# Patient Record
Sex: Female | Born: 2005 | Race: White | Hispanic: No | Marital: Single | Smoking: Never smoker
Health system: Southern US, Community
[De-identification: ages and names within clinical notes are randomized; demographics above are authoritative.]

## PROBLEM LIST (undated history)

## (undated) DIAGNOSIS — J45909 Unspecified asthma, uncomplicated: Secondary | ICD-10-CM

## (undated) DIAGNOSIS — N390 Urinary tract infection, site not specified: Secondary | ICD-10-CM

## (undated) DIAGNOSIS — N289 Disorder of kidney and ureter, unspecified: Secondary | ICD-10-CM

## (undated) DIAGNOSIS — Z9109 Other allergy status, other than to drugs and biological substances: Secondary | ICD-10-CM

## (undated) HISTORY — DX: Unspecified asthma, uncomplicated: J45.909

## (undated) HISTORY — PX: EYE SURGERY: SHX253

---
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
## 2024-03-03 ENCOUNTER — Encounter: Payer: Self-pay | Admitting: Allergy & Immunology

## 2024-03-03 ENCOUNTER — Ambulatory Visit: Payer: 59 | Admitting: Allergy & Immunology

## 2024-03-03 ENCOUNTER — Other Ambulatory Visit: Payer: Self-pay

## 2024-03-03 VITALS — BP 124/80 | HR 84 | Temp 97.8°F | Resp 18 | Ht 66.0 in | Wt 172.0 lb

## 2024-03-03 DIAGNOSIS — J302 Other seasonal allergic rhinitis: Secondary | ICD-10-CM

## 2024-03-03 DIAGNOSIS — J4530 Mild persistent asthma, uncomplicated: Secondary | ICD-10-CM

## 2024-03-03 DIAGNOSIS — J3089 Other allergic rhinitis: Secondary | ICD-10-CM | POA: Diagnosis not present

## 2024-03-03 MED ORDER — MONTELUKAST SODIUM 10 MG PO TABS: 10.0000 mg | ORAL_TABLET | Freq: Every day | ORAL | 3 refills | Status: AC

## 2024-03-03 NOTE — Patient Instructions (Addendum)
 1. Mild persistent asthma, uncomplicated - Lung testing looks great today. - Asthma seems to be well controlled.  - Daily controller medication(s): Singulair  (montelukast ) 10mg  daily - Rescue medications: Albuterol  1-2 puffs every 4-6 hours as needed for wheezing/coughing.  - Asthma control goals:  * Full participation in all desired activities (may need albuterol  before activity) * Albuterol  use two time or less a week on average (not counting use with activity) * Cough interfering with sleep two time or less a month * Oral steroids no more than once a year * No hospitalizations  2. Seasonal and perennial allergic rhinitis (grasses, trees, some molds, dust mites, cat, and horse) - Continue with Singulair  10mg  daily.  - Continue with Flonase  one spray per nostril up to twice daily as needed for stuffy nose.  - Continue with Zyrtec  (cetirizine ) 10 mg once a day as needed for runny nose. - There is no need for an EpiPen  since you no longer are on the allergy  shots.  - I think that you are fine staying off of allergy  shots.   3. Return in about 1 year (around 03/03/2025). You can have the follow up appointment with Dr. Iva or a Nurse Practicioner (our Nurse Practitioners are excellent and always have Physician oversight!).    Please inform us  of any Emergency Department visits, hospitalizations, or changes in symptoms. Call us  before going to the ED for breathing or allergy  symptoms since we might be able to fit you in for a sick visit. Feel free to contact us  anytime with any questions, problems, or concerns.  It was a pleasure to see you again today!  Websites that have reliable patient information: 1. American Academy of Asthma, Allergy , and Immunology: www.aaaai.org 2. Food Allergy  Research and Education (FARE): foodallergy.org 3. Mothers of Asthmatics: http://www.asthmacommunitynetwork.org 4. [employer] of Allergy , Asthma, and Immunology: www.acaai.org      "Like" us   on Facebook and Instagram for our latest updates!      A healthy democracy works best when [employer] participate! Make sure you are registered to vote! If you have moved or changed any of your contact information, you will need to get this updated before voting! Scan the QR codes below to learn more!

## 2024-03-03 NOTE — Progress Notes (Signed)
 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 Mild persistent asthma, uncomplicated - Lung testing looks great today. - Asthma seems to be well controlled.  - Daily controller medication(s): Singulair  (montelukast ) 10mg  daily - Rescue medications: Albuterol  1-2 puffs every 4-6 hours as needed for wheezing/coughing.  - Asthma control goals:  * Full participation in all desired activities (may need albuterol  before activity) * Albuterol  use two time or less a week on average (not counting use with activity) * Cough interfering with sleep two time or less a month * Oral steroids no more than once a year * No hospitalizations  2. Seasonal and perennial allergic rhinitis (grasses, trees, some molds, dust mites, cat, and horse) - Continue with Singulair  10mg  daily.  - Continue with Flonase  one spray per nostril up to twice daily as needed for stuffy nose.  - Continue with Zyrtec  (cetirizine ) 10 mg once a day as needed for runny nose. - There is no need for an EpiPen  since you no longer are on the allergy  shots.  - I think that you are fine staying off of allergy  shots.   3. Return in about 1 year (around 03/03/2025). You can have the follow up appointment with Dr. Iva or a Nurse Practicioner (our Nurse Practitioners are excellent and always have Physician oversight!).   Subjective:   Amber Valenzuela is a 18 y.o. female presenting today for follow up of  Chief Complaint  Patient presents with   Follow-up    Patient presents to the office for 1 year follow up.    Amber Valenzuela has a history of the following: Patient Active Problem List   Diagnosis Date Noted   Seasonal and perennial allergic rhinitis 01/25/2019    Mild persistent asthma without complication 04/24/2016   Allergic conjunctivitis 04/24/2016   Allergic rhinitis 12/20/2014    History obtained from: chart review and patient.  Discussed the use of AI scribe software for clinical note transcription with the patient and/or guardian, who gave verbal consent to proceed.  Amber Valenzuela is a 18 y.o. female presenting for a follow up visit. She was last seen in December 2024. At that time, lung testin gwas not done.we continued with montelukast  daily as well as albuterol  as needed.  She had testing that was positive to grasses, trees, molds, dust mite, cat, and horse.  They decided to stop allergy  shots.  We continue with Singulair  as well as Flonase  and Zyrtec .  Since last visit, she has done very well.  Asthma/Respiratory Symptom History: She remains on the montelukast  daily. She does have albuterol  to use as needed. She has not needed any prednisone  and has not been to the hospital for symptoms at all.   Allergic Rhinitis Symptom History: She has a history of allergies and previously received allergy  shots, which have been discontinued. She reports that her breathing is good, though she experiences some difficulty with changes in weather, which she describes as 'expected'.  She continues to take montelukast  daily and uses her albuterol  inhaler infrequently, only once or twice in the last six months. Her allergy  symptoms are manageable, though they worsen with the transition to cold weather and when leaves are dying, which she attributes to her mold allergy . She uses Flonase  occasionally  as needed and takes Zyrtec  over the counter. She has not identified any food allergies.  Infection Symptom History: She experienced a severe sinus infection about a month ago, for which she was prescribed prednisone  by an urgent care provider. She tends to get sinus infections when the weather changes.  She is the middle child in her family, with one full sibling and  several half-siblings. Her parents separated when she was [age], and she does not have a close relationship with her extended family.   Otherwise, there have been no changes to her past medical history, surgical history, family history, or social history.    Review of systems otherwise negative other than that mentioned in the HPI.    Objective:   Blood pressure 124/80, pulse 84, temperature 97.8 F (36.6 C), temperature source Temporal, resp. rate 18, height 5' 6 (1.676 m), weight 172 lb (78 kg), SpO2 98%. Body mass index is 27.76 kg/m.    Physical Exam Vitals reviewed.  Constitutional:      Appearance: She is well-developed.  HENT:     Head: Normocephalic and atraumatic.     Right Ear: Tympanic membrane, ear canal and external ear normal.     Left Ear: Tympanic membrane, ear canal and external ear normal.     Nose: No nasal deformity, septal deviation, mucosal edema or rhinorrhea.     Right Turbinates: Enlarged, swollen and pale.     Left Turbinates: Enlarged, swollen and pale.     Right Sinus: No maxillary sinus tenderness or frontal sinus tenderness.     Left Sinus: No maxillary sinus tenderness or frontal sinus tenderness.     Comments: No polyps noted.     Mouth/Throat:     Mouth: Mucous membranes are not pale and not dry.     Pharynx: Uvula midline.  Eyes:     General: Lids are normal. No allergic shiner.       Right eye: No discharge.        Left eye: No discharge.     Conjunctiva/sclera: Conjunctivae normal.     Right eye: Right conjunctiva is not injected. No chemosis.    Left eye: Left conjunctiva is not injected. No chemosis.    Pupils: Pupils are equal, round, and reactive to light.  Cardiovascular:     Rate and Rhythm: Normal rate and regular rhythm.     Heart sounds: Normal heart sounds.  Pulmonary:     Effort: Pulmonary effort is normal. No tachypnea, accessory muscle usage or respiratory distress.     Breath sounds: Normal breath sounds. No wheezing,  rhonchi or rales.  Chest:     Chest wall: No tenderness.  Lymphadenopathy:     Cervical: No cervical adenopathy.  Skin:    Coloration: Skin is not pale.     Findings: No abrasion, erythema, petechiae or rash. Rash is not papular, urticarial or vesicular.  Neurological:     Mental Status: She is alert.  Psychiatric:        Behavior: Behavior is cooperative.      Diagnostic studies:    Spirometry: results normal (FEV1: 3.08/123%, FVC: 4.30/154%, FEV1/FVC: 72%).    Spirometry consistent with normal pattern.   Allergy  Studies: none     Marty Shaggy, MD  [facility]  

## 2024-03-06 ENCOUNTER — Encounter: Payer: Self-pay | Admitting: Allergy & Immunology

## 2024-04-03 ENCOUNTER — Emergency Department (HOSPITAL_COMMUNITY)
Admission: EM | Admit: 2024-04-03 | Discharge: 2024-04-03 | Disposition: A | Discharge status: Home / Self Care | Attending: Emergency Medicine | Admitting: Emergency Medicine

## 2024-04-03 ENCOUNTER — Encounter (HOSPITAL_COMMUNITY): Payer: Self-pay

## 2024-04-03 DIAGNOSIS — Z3491 Encounter for supervision of normal pregnancy, unspecified, first trimester: Secondary | ICD-10-CM | POA: Diagnosis present

## 2024-04-03 LAB — POC URINE PREG, ED: Preg Test, Ur: POSITIVE — AB

## 2024-04-03 NOTE — ED Triage Notes (Signed)
 Pt reports she needs a positive pregnancy test documented in her medical record to get an appointment with her OB/GYN.  Pt reports last period x1 month ago.  Pt sts she has 4 positive pregnancy tests at home.

## 2024-04-03 NOTE — ED Provider Notes (Signed)
 " [place] EMERGENCY DEPARTMENT AT [facility] Provider Note   CSN: [id number] Arrival date & time: 04/03/24  1758     Patient presents with: Possible Pregnancy   Amber Valenzuela is a 19 y.o. female.  Patient without significant Eckel history presents to the emergency department today with concerns of possible pregnancy.  Reports she has had multiple positive pregnancy test at home over the last several days and requires a documented positive pregnancy test to schedule an appoint with her OB/GYN.  She denies any abdominal pain, vaginal bleeding, spotting, or significant vomiting.  She otherwise has no concerns.    Possible Pregnancy       Prior to Admission medications  Medication Sig Start Date End Date Taking? Authorizing Provider  albuterol  (VENTOLIN  HFA) 108 (90 Base) MCG/ACT inhaler Inhale 1-2 puffs into the lungs every 6 (six) hours as needed for wheezing or shortness of breath. 02/15/23   Tobie Arleta SQUIBB, MD  cetirizine  (ZYRTEC ) 10 MG tablet Take 1 tablet (10 mg total) by mouth daily as needed for allergies. 02/15/23   Tobie Arleta SQUIBB, MD  fluticasone  (FLONASE ) 50 MCG/ACT nasal spray Place 1 to 2 sprays in each nostril once a day as needed for stuffy nose 02/15/23   Tobie Arleta SQUIBB, MD  montelukast  (SINGULAIR ) 10 MG tablet Take 1 tablet (10 mg total) by mouth at bedtime. 03/03/24   Iva Marty Saltness, MD    Allergies: Bactrim  [sulfamethoxazole -trimethoprim ] and Other    Review of Systems  Genitourinary:  Negative for menstrual problem.  All other systems reviewed and are negative.   Updated Vital Signs BP 117/68 (BP Location: Right Arm)   Pulse 83   Temp 98.8 F (37.1 C) (Oral)   Resp 17   Ht 5' 6 (1.676 m)   Wt 78 kg   LMP 01/29/2024 (Approximate)   SpO2 100%   BMI 27.76 kg/m   Physical Exam Vitals and nursing note reviewed.  Constitutional:      General: She is not in acute distress.    Appearance: Normal appearance. She is well-developed and  normal weight. She is not ill-appearing or toxic-appearing.  HENT:     Head: Normocephalic and atraumatic.  Eyes:     Conjunctiva/sclera: Conjunctivae normal.  Cardiovascular:     Rate and Rhythm: Normal rate and regular rhythm.     Heart sounds: No murmur heard. Pulmonary:     Effort: Pulmonary effort is normal. No respiratory distress.     Breath sounds: Normal breath sounds.  Abdominal:     General: Abdomen is flat. Bowel sounds are normal. There is no distension.     Palpations: Abdomen is soft.     Tenderness: There is no abdominal tenderness. There is no guarding.  Musculoskeletal:        General: No swelling.     Cervical back: Neck supple.  Skin:    General: Skin is warm and dry.     Capillary Refill: Capillary refill takes less than 2 seconds.  Neurological:     Mental Status: She is alert.  Psychiatric:        Mood and Affect: Mood normal.     (all labs ordered are listed, but only abnormal results are displayed) Labs Reviewed  POC URINE PREG, ED - Abnormal; Notable for the following components:      Result Value   Preg Test, Ur POSITIVE (*)    All other components within normal limits    EKG: None  Radiology:  No results found.   Procedures   Medications Ordered in the ED - No data to display                                  Medical Decision Making  This patient presents to the ED for concern of possible pregnancy.  Differential diagnosis includes for semester pregnancy, ectopic pregnancy, threatened miscarriage, subchorionic hemorrhage    Additional history obtained:  Additional history obtained from chart review   Lab Tests:  I Ordered, and personally interpreted labs.  The pertinent results include: Positive urine pregnancy   Problem List / ED Course:  Patient with past history significant for asthma presents to the emergency department concerns of possible pregnancy.  Reports she has had 4 positive pregnancy test at home and states  that she requires a document pregnancy test to show that she is pregnant to establish care with OB/GYN.  She has no concerns at this time for abdominal pain, vaginal bleeding or spotting, or significant vomiting.  She estimates that she is somewhere around 5 to [redacted] weeks pregnant although she is not exactly sure but states her last period was about 1 month ago. Physical exam is unremarkable.  No abdominal tenderness.  GU exam deferred. Urine pregnancy exam today is positive. With positive pregnancy test and no symptoms, there is no need for imaging or other testing at this time.  Advised patient to establish care with OB/GYN for further evaluation and monitoring of pregnancy that is assumed to be in first trimester at this time.  Return precautions advised.  She is otherwise stable for outpatient follow-up and discharged home.   Social Determinants of Health:  None  Final diagnoses:  First trimester pregnancy    ED Discharge Orders     None          Jhanae Jaskowiak A, PA-C 04/03/24 2138    Patsey Lot, MD 04/03/24 2312  "

## 2024-04-03 NOTE — ED Notes (Signed)
 Pt completely assessed by EDP and set to discharge. No RN assessment performed.  Pt provided discharge instructions and prescription information. Pt was given the opportunity to ask questions and questions were answered.

## 2024-04-03 NOTE — Discharge Instructions (Signed)
 You were seen in the ER today for concerns of pregnancy. You had a positive pregnancy test today with us . Please reach out to your OBGYN to schedule your initial appointment for further evaluation. For any concerns of abdomina pain, vaginal bleeding, spotting, please seek evaluation with your OBGYN or return to the ER.

## 2024-04-08 ENCOUNTER — Encounter (HOSPITAL_COMMUNITY): Payer: Self-pay

## 2024-04-08 ENCOUNTER — Emergency Department (HOSPITAL_COMMUNITY)
Admission: EM | Admit: 2024-04-08 | Discharge: 2024-04-08 | Disposition: A | Discharge status: Home / Self Care | Attending: Emergency Medicine | Admitting: Emergency Medicine

## 2024-04-08 ENCOUNTER — Other Ambulatory Visit: Payer: Self-pay

## 2024-04-08 ENCOUNTER — Emergency Department (HOSPITAL_COMMUNITY)

## 2024-04-08 DIAGNOSIS — O26891 Other specified pregnancy related conditions, first trimester: Secondary | ICD-10-CM | POA: Insufficient documentation

## 2024-04-08 DIAGNOSIS — R1030 Lower abdominal pain, unspecified: Secondary | ICD-10-CM | POA: Insufficient documentation

## 2024-04-08 DIAGNOSIS — R300 Dysuria: Secondary | ICD-10-CM | POA: Insufficient documentation

## 2024-04-08 DIAGNOSIS — Z3A01 Less than 8 weeks gestation of pregnancy: Secondary | ICD-10-CM | POA: Diagnosis not present

## 2024-04-08 LAB — COMPREHENSIVE METABOLIC PANEL WITH GFR
ALT: 11 U/L (ref 0–44)
AST: 15 U/L (ref 15–41)
Albumin: 4.1 g/dL (ref 3.5–5.0)
Alkaline Phosphatase: 60 U/L (ref 38–126)
Anion gap: 15 (ref 5–15)
BUN: 9 mg/dL (ref 6–20)
CO2: 18 mmol/L — ABNORMAL LOW (ref 22–32)
Calcium: 8.6 mg/dL — ABNORMAL LOW (ref 8.9–10.3)
Chloride: 108 mmol/L (ref 98–111)
Creatinine, Ser: 0.57 mg/dL (ref 0.44–1.00)
GFR, Estimated: >60 mL/min
Glucose, Bld: 99 mg/dL (ref 70–99)
Potassium: 3.7 mmol/L (ref 3.5–5.1)
Sodium: 141 mmol/L (ref 135–145)
Total Bilirubin: 0.3 mg/dL (ref 0.0–1.2)
Total Protein: 6.3 g/dL — ABNORMAL LOW (ref 6.5–8.1)

## 2024-04-08 LAB — URINALYSIS, ROUTINE W REFLEX MICROSCOPIC
Bilirubin Urine: NEGATIVE
Glucose, UA: NEGATIVE mg/dL
Hgb urine dipstick: NEGATIVE
Ketones, ur: NEGATIVE mg/dL
Nitrite: NEGATIVE
Protein, ur: NEGATIVE mg/dL
Specific Gravity, Urine: 1.018 (ref 1.005–1.030)
pH: 7 (ref 5.0–8.0)

## 2024-04-08 LAB — CBC WITH DIFFERENTIAL/PLATELET
Abs Immature Granulocytes: 0.03 K/uL (ref 0.00–0.07)
Basophils Absolute: 0.1 K/uL (ref 0.0–0.1)
Basophils Relative: 1 %
Eosinophils Absolute: 0.1 K/uL (ref 0.0–0.5)
Eosinophils Relative: 1 %
HCT: 40.6 % (ref 36.0–46.0)
Hemoglobin: 13.8 g/dL (ref 12.0–15.0)
Immature Granulocytes: 0 %
Lymphocytes Relative: 21 %
Lymphs Abs: 1.9 K/uL (ref 0.7–4.0)
MCH: 30.9 pg (ref 26.0–34.0)
MCHC: 34 g/dL (ref 30.0–36.0)
MCV: 91 fL (ref 80.0–100.0)
Monocytes Absolute: 0.6 K/uL (ref 0.1–1.0)
Monocytes Relative: 7 %
Neutro Abs: 6.4 K/uL (ref 1.7–7.7)
Neutrophils Relative %: 70 %
Platelets: 266 K/uL (ref 150–400)
RBC: 4.46 MIL/uL (ref 3.87–5.11)
RDW: 13 % (ref 11.5–15.5)
WBC: 9 K/uL (ref 4.0–10.5)
nRBC: 0 % (ref 0.0–0.2)

## 2024-04-08 LAB — HCG, QUANTITATIVE, PREGNANCY: hCG, Beta Chain, Quant, S: 4169 m[IU]/mL — ABNORMAL HIGH

## 2024-04-08 MED ORDER — NITROFURANTOIN MONOHYD MACRO 100 MG PO CAPS
100.0000 mg | ORAL_CAPSULE | Freq: Once | ORAL | Status: AC
  Administered 2024-04-08: 100 mg via ORAL
  Filled 2024-04-08: qty 1

## 2024-04-08 MED ORDER — NITROFURANTOIN MONOHYD MACRO 100 MG PO CAPS: 100.0000 mg | ORAL_CAPSULE | Freq: Two times a day (BID) | ORAL | 0 refills | Status: AC

## 2024-04-08 NOTE — ED Notes (Signed)
 Charge nurse at the bedside and card given with info to contact patient experience person.

## 2024-04-08 NOTE — ED Notes (Addendum)
 Pt walked back from [place]  with tech and was smiling and conversing and in no sign of distress.

## 2024-04-08 NOTE — ED Notes (Addendum)
 Female at pt bedside came out and asked to speak with a higher up because of a problem while in [place] .  AC and charge nurse informed.

## 2024-04-08 NOTE — ED Provider Notes (Incomplete Revision)
 " [place] EMERGENCY DEPARTMENT AT [facility] Provider Note   CSN: [id number] Arrival date & time: 04/08/24  1932     Patient presents with: Dysuria (5 weeks preg)   Amber Valenzuela is a 19 y.o. female.   Patient is a 19 year old female who is approximately [redacted] weeks pregnant who presents emergency department the chief complaint of lower abdominal cramping and cramping to her lower back.  She notes that she has had no vaginal discharge or bleeding.  She does admit to some associated dysuria and is concerned that she may be developing a urinary tract infection.  She has not seen a gynecologist as of yet for this pregnancy.  This is her first pregnancy.  She denies any recent falls or blunt trauma to her abdomen or back.   Dysuria      Prior to Admission medications  Medication Sig Start Date End Date Taking? Authorizing Provider  albuterol  (VENTOLIN  HFA) 108 (90 Base) MCG/ACT inhaler Inhale 1-2 puffs into the lungs every 6 (six) hours as needed for wheezing or shortness of breath. 02/15/23   Tobie Arleta SQUIBB, MD  cetirizine  (ZYRTEC ) 10 MG tablet Take 1 tablet (10 mg total) by mouth daily as needed for allergies. 02/15/23   Tobie Arleta SQUIBB, MD  fluticasone  (FLONASE ) 50 MCG/ACT nasal spray Place 1 to 2 sprays in each nostril once a day as needed for stuffy nose 02/15/23   Tobie Arleta SQUIBB, MD  montelukast  (SINGULAIR ) 10 MG tablet Take 1 tablet (10 mg total) by mouth at bedtime. 03/03/24   Iva Marty Saltness, MD    Allergies: Bactrim  [sulfamethoxazole -trimethoprim ] and Other    Review of Systems  Genitourinary:  Positive for dysuria.  All other systems reviewed and are negative.   Updated Vital Signs BP 107/63   Pulse 79   Temp 98.6 F (37 C) (Oral)   Resp 17   Ht 5' 6 (1.676 m)   Wt 78 kg   LMP 02/19/2024 (Approximate)   SpO2 95%   BMI 27.76 kg/m   Physical Exam Vitals and nursing note reviewed.  Constitutional:      General: She is not in acute distress.     Appearance: Normal appearance. She is not ill-appearing.  HENT:     Head: Normocephalic and atraumatic.     Nose: Nose normal.     Mouth/Throat:     Mouth: Mucous membranes are moist.  Eyes:     Extraocular Movements: Extraocular movements intact.     Conjunctiva/sclera: Conjunctivae normal.     Pupils: Pupils are equal, round, and reactive to light.  Cardiovascular:     Rate and Rhythm: Normal rate and regular rhythm.     Pulses: Normal pulses.     Heart sounds: Normal heart sounds. No murmur heard.    No gallop.  Pulmonary:     Effort: Pulmonary effort is normal. No respiratory distress.     Breath sounds: Normal breath sounds. No stridor. No wheezing, rhonchi or rales.  Abdominal:     General: Abdomen is flat. Bowel sounds are normal. There is no distension.     Palpations: Abdomen is soft.     Tenderness: There is no guarding.     Comments: Mild tenderness palpation over lower abdomen  Musculoskeletal:        General: Normal range of motion.     Cervical back: Normal range of motion and neck supple. No rigidity or tenderness.  Skin:    General: Skin is warm  and dry.     Findings: No bruising or rash.  Neurological:     General: No focal deficit present.     Mental Status: She is alert and oriented to person, place, and time. Mental status is at baseline.  Psychiatric:        Mood and Affect: Mood normal.        Behavior: Behavior normal.        Thought Content: Thought content normal.        Judgment: Judgment normal.     (all labs ordered are listed, but only abnormal results are displayed) Labs Reviewed  URINALYSIS, ROUTINE W REFLEX MICROSCOPIC  PREGNANCY, URINE  COMPREHENSIVE METABOLIC PANEL WITH GFR  CBC WITH DIFFERENTIAL/PLATELET    EKG: None  Radiology: No results found.   Procedures   Medications Ordered in the ED - No data to display                                  Medical Decision Making Patient is doing very well at this time and is stable  for discharge home.  Discussed with patient that ultrasound demonstrates intrauterine pregnancy but fetal heart tones have not been noted at this time.  Patient has had no vaginal bleeding or discharge.  Will cover for urinary tract infection at this point.  Patient already has follow-up with gynecology in 6 days and did note the patient needs to follow-up with them within 7 to 10 days for beta-hCG as well as repeat ultrasound to ensure viability.  Patient's blood work has been otherwise unremarkable at this point.  Do not suspect any further emergent workup is warranted at this time.  She has no indication for ectopic pregnancy.  Strict turn precautions were provided for any new or worsening symptoms.  Patient voiced understanding and had no additional questions.  Patient is already on a prenatal vitamin at this time.  Amount and/or Complexity of Data Reviewed Labs: ordered. Radiology: ordered.  Risk Prescription drug management.        Final diagnoses:  None    ED Discharge Orders     None          Daralene Lonni BIRCH, PA-C 04/08/24 2235  "

## 2024-04-08 NOTE — ED Provider Notes (Cosign Needed)
 " Amber Valenzuela   CSN: [id number] Arrival date & time: 04/08/24  1932     Patient presents with: Dysuria (5 weeks preg)   Amber Valenzuela is a 19 y.o. female.   Patient is a 19 year old female who is approximately [redacted] weeks pregnant who presents emergency department the chief complaint of lower abdominal cramping and cramping to her lower back.  She notes that she has had no vaginal discharge or bleeding.  She does admit to some associated dysuria and is concerned that she may be developing a urinary tract infection.  She has not seen a gynecologist as of yet for this pregnancy.  This is her first pregnancy.  She denies any recent falls or blunt trauma to her abdomen or back.   Dysuria      Prior to Admission medications  Medication Sig Start Date End Date Taking? Authorizing Provider  albuterol  (VENTOLIN  HFA) 108 (90 Base) MCG/ACT inhaler Inhale 1-2 puffs into the lungs every 6 (six) hours as needed for wheezing or shortness of breath. 02/15/23   Tobie Arleta SQUIBB, MD  cetirizine  (ZYRTEC ) 10 MG tablet Take 1 tablet (10 mg total) by mouth daily as needed for allergies. 02/15/23   Tobie Arleta SQUIBB, MD  fluticasone  (FLONASE ) 50 MCG/ACT nasal spray Place 1 to 2 sprays in each nostril once a day as needed for stuffy nose 02/15/23   Tobie Arleta SQUIBB, MD  montelukast  (SINGULAIR ) 10 MG tablet Take 1 tablet (10 mg total) by mouth at bedtime. 03/03/24   Iva Marty Saltness, MD    Allergies: Bactrim  [sulfamethoxazole -trimethoprim ] and Other    Review of Systems  Genitourinary:  Positive for dysuria.  All other systems reviewed and are negative.   Updated Vital Signs BP 107/63   Pulse 79   Temp 98.6 F (37 C) (Oral)   Resp 17   Ht 5' 6 (1.676 m)   Wt 78 kg   LMP 02/19/2024 (Approximate)   SpO2 95%   BMI 27.76 kg/m   Physical Exam Vitals and nursing Valenzuela reviewed.  Constitutional:      General: She is not in acute distress.     Appearance: Normal appearance. She is not ill-appearing.  HENT:     Head: Normocephalic and atraumatic.     Nose: Nose normal.     Mouth/Throat:     Mouth: Mucous membranes are moist.  Eyes:     Extraocular Movements: Extraocular movements intact.     Conjunctiva/sclera: Conjunctivae normal.     Pupils: Pupils are equal, round, and reactive to light.  Cardiovascular:     Rate and Rhythm: Normal rate and regular rhythm.     Pulses: Normal pulses.     Heart sounds: Normal heart sounds. No murmur heard.    No gallop.  Pulmonary:     Effort: Pulmonary effort is normal. No respiratory distress.     Breath sounds: Normal breath sounds. No stridor. No wheezing, rhonchi or rales.  Abdominal:     General: Abdomen is flat. Bowel sounds are normal. There is no distension.     Palpations: Abdomen is soft.     Tenderness: There is no guarding.     Comments: Mild tenderness palpation over lower abdomen  Musculoskeletal:        General: Normal range of motion.     Cervical back: Normal range of motion and neck supple. No rigidity or tenderness.  Skin:    General: Skin is warm  and dry.     Findings: No bruising or rash.  Neurological:     General: No focal deficit present.     Mental Status: She is alert and oriented to person, place, and time. Mental status is at baseline.  Psychiatric:        Mood and Affect: Mood normal.        Behavior: Behavior normal.        Thought Content: Thought content normal.        Judgment: Judgment normal.     (all labs ordered are listed, but only abnormal results are displayed) Labs Reviewed  URINALYSIS, ROUTINE W REFLEX MICROSCOPIC  PREGNANCY, URINE  COMPREHENSIVE METABOLIC PANEL WITH GFR  CBC WITH DIFFERENTIAL/PLATELET    EKG: None  Radiology: No results found.   Procedures   Medications Ordered in the ED - No data to display                                  Medical Decision Making Patient is doing very well at this time and is stable  for discharge home.  Discussed with patient that ultrasound demonstrates intrauterine pregnancy but fetal heart tones have not been noted at this time.  Patient has had no vaginal bleeding or discharge.  Will cover for urinary tract infection at this point.  Patient already has follow-up with gynecology in 6 days and did Valenzuela the patient needs to follow-up with them within 7 to 10 days for beta-hCG as well as repeat ultrasound to ensure viability.  Patient's blood work has been otherwise unremarkable at this point.  Do not suspect any further emergent workup is warranted at this time.  She has no indication for ectopic pregnancy.  Strict turn precautions were provided for any new or worsening symptoms.  Patient voiced understanding and had no additional questions.  Patient is already on a prenatal vitamin at this time.  Amount and/or Complexity of Data Reviewed Labs: ordered. Radiology: ordered.  Risk Prescription drug management.        Final diagnoses:  None    ED Discharge Orders     None          Daralene Lonni BIRCH, PA-C 04/08/24 2235    Daralene Lonni BIRCH, PA-C 04/09/24 1258    Cleotilde Rogue, MD 04/09/24 2110  "

## 2024-04-08 NOTE — ED Notes (Signed)
 Female visitor is back at the bedside using loud, aggressive language, stating that his lawyer said we had to give him the name of the chaperone in [place] .  Charge nurse already informed them we could not give out employee names and provided them with info if they needed to file a complaint.

## 2024-04-08 NOTE — Discharge Instructions (Addendum)
 Follow-up closely with your gynecologist as scheduled next week for repeat evaluation, ultrasound and blood work.  Return to emergency department immediately for any new or worsening symptoms.

## 2024-04-08 NOTE — ED Triage Notes (Signed)
 Pt presents with cramping sensation in her lower back and abd with associated dysuria. Pt states symptoms feel like previous UTIs. Pt is pregnant and estimates at 5 weeks. She has not had her first OB/GYN appointment yet. G1P0

## 2024-04-10 LAB — URINE CULTURE: Culture: <10000 — AB

## 2024-04-13 ENCOUNTER — Other Ambulatory Visit: Payer: Self-pay | Admitting: Adult Health

## 2024-04-13 DIAGNOSIS — Z3490 Encounter for supervision of normal pregnancy, unspecified, unspecified trimester: Secondary | ICD-10-CM

## 2024-04-13 NOTE — Progress Notes (Signed)
 ED visit on 04/08/24 for abdominal pain with U/S done and no cardiac activity seen. Repeat HCG

## 2024-04-14 ENCOUNTER — Other Ambulatory Visit

## 2024-04-15 LAB — BETA HCG QUANT (REF LAB): hCG Quant: 24680 m[IU]/mL

## 2024-04-17 ENCOUNTER — Ambulatory Visit: Payer: Self-pay | Admitting: Adult Health

## 2024-04-25 ENCOUNTER — Other Ambulatory Visit: Payer: Self-pay | Admitting: Obstetrics & Gynecology

## 2024-04-25 DIAGNOSIS — O3680X0 Pregnancy with inconclusive fetal viability, not applicable or unspecified: Secondary | ICD-10-CM

## 2024-04-28 ENCOUNTER — Ambulatory Visit

## 2024-04-28 DIAGNOSIS — O3680X0 Pregnancy with inconclusive fetal viability, not applicable or unspecified: Secondary | ICD-10-CM

## 2024-06-02 ENCOUNTER — Encounter: Admitting: Obstetrics and Gynecology

## 2024-06-02 ENCOUNTER — Encounter: Admitting: *Deleted

## 2025-03-07 ENCOUNTER — Ambulatory Visit: Admitting: Allergy & Immunology
